# Patient Record
Sex: Male | Born: 1995 | Hispanic: No | Marital: Single | Smoking: Never smoker
Health system: Southern US, Community
[De-identification: ages and names within clinical notes are randomized; demographics above are authoritative.]

## PROBLEM LIST (undated history)

## (undated) DIAGNOSIS — R569 Unspecified convulsions: Secondary | ICD-10-CM

## (undated) HISTORY — PX: APPENDECTOMY: SHX54

---

## 2021-10-22 ENCOUNTER — Encounter (HOSPITAL_COMMUNITY): Payer: Self-pay | Admitting: Emergency Medicine

## 2021-10-22 ENCOUNTER — Emergency Department (HOSPITAL_COMMUNITY)
Admission: EM | Admit: 2021-10-22 | Discharge: 2021-10-22 | Disposition: A | Discharge status: Home / Self Care | Payer: Medicaid Other | Attending: Emergency Medicine | Admitting: Emergency Medicine

## 2021-10-22 DIAGNOSIS — T65291A Toxic effect of other tobacco and nicotine, accidental (unintentional), initial encounter: Secondary | ICD-10-CM | POA: Diagnosis not present

## 2021-10-22 DIAGNOSIS — T6591XA Toxic effect of unspecified substance, accidental (unintentional), initial encounter: Secondary | ICD-10-CM

## 2021-10-22 NOTE — Discharge Instructions (Signed)
Please read the enclosed information about stopping smoking.

## 2021-10-22 NOTE — ED Triage Notes (Signed)
Per pt, states he was smoking a couple of hours ago-he had 2 water bottles one of which he was putting his ashes in-states he accidentally drank the bottle with his smoke ashes in it-states he later felt his hands sweating and his throat was itching-no vomiting, denies pain

## 2021-10-22 NOTE — ED Provider Notes (Signed)
[facility]-EMERGENCY DEPT Provider Note   CSN: [id number] Arrival date & time: 10/22/21  1519     History Chief Complaint  Patient presents with   Ingestion    Nathaniel Velez is a 25 y.o. male who presents today for evaluation of drinking cigar ash. He states that he was smoking a cigar, and had smoked about an inch a bit.  He mixed up the water bottle that he was drinking from in the water bottle that he was putting the ash into.  He states that he accidentally drank it. After his hands got sweaty.  He denies shortness of breath or fevers.  The only ash was the one inch of ash from the cigar he was smoking.  This occurred about 1-2 hours PTA.   HPI     History reviewed. No pertinent past medical history.  There are no problems to display for this patient.   History reviewed. No pertinent surgical history.     No family history on file.  Social History   Substance Use Topics   Drug use: Yes    Home Medications Prior to Admission medications   Not on File    Allergies    Amoxicillin, Ketamine, and Penicillins  Review of Systems   Review of Systems  Constitutional:  Negative for chills and fever.       Sweating hands  HENT:  Positive for sore throat.   Respiratory:  Negative for cough.   Gastrointestinal:  Positive for nausea. Negative for abdominal pain and vomiting.  Neurological:  Negative for headaches.  All other systems reviewed and are negative.  Physical Exam Updated Vital Signs BP (!) 141/99 (BP Location: Right Arm)   Pulse 70   Temp 98.2 F (36.8 C) (Oral)   Resp 17   SpO2 100%   Physical Exam Vitals and nursing note reviewed.  Constitutional:      General: He is not in acute distress.    Appearance: Normal appearance. He is not ill-appearing.  HENT:     Head: Normocephalic and atraumatic.     Mouth/Throat:     Mouth: Mucous membranes are moist.     Pharynx: Oropharynx is clear. No oropharyngeal exudate or  posterior oropharyngeal erythema.  Cardiovascular:     Rate and Rhythm: Normal rate and regular rhythm.     Pulses: Normal pulses.     Heart sounds: Normal heart sounds.  Pulmonary:     Effort: Pulmonary effort is normal. No respiratory distress.     Breath sounds: Normal breath sounds. No wheezing.  Musculoskeletal:     Cervical back: Normal range of motion and neck supple. No rigidity.  Skin:    General: Skin is warm and dry.  Neurological:     Mental Status: He is alert. Mental status is at baseline.     Comments: Awake and alert, answers all questions appropriately.  Speech is not slurred.    Psychiatric:        Mood and Affect: Mood normal.        Behavior: Behavior normal.    ED Results / Procedures / Treatments   Labs (all labs ordered are listed, but only abnormal results are displayed) Labs Reviewed - No data to display  EKG None  Radiology No results found.  Procedures Procedures   Medications Ordered in ED Medications - No data to display  ED Course  I have reviewed the triage vital signs and the nursing notes.  Pertinent labs &  imaging results that were available during my care of the patient were reviewed by me and considered in my medical decision making (see chart for details).    MDM Rules/Calculators/A&P                          Patient is a 25 year old man who presents today for evaluation after he was smoking a cigar, had smoked about 1 inch of the cigar and then accidentally drink the water that he was tapping the ash into.  This occurred about 2 to 3 hours prior to my evaluation.  He is awake and alert and generally well-appearing.  Aside from his throat itching which resolved, his hand sweating which has improved, and his mild nausea without vomiting he is not having any significant symptoms.  This does not appear to be a significant amount of ingestion because nicotine poisoning given the small amount, and that he was smoking a cigar. Recommended  smoking cessation.  He is hemodynamically stable.  Return precautions were discussed with patient who states their understanding.  At the time of discharge patient denied any unaddressed complaints or concerns.  Patient is agreeable for discharge home.  Note: Portions of this report may have been transcribed using voice recognition software. Every effort was made to ensure accuracy; however, inadvertent computerized transcription errors may be present   Final Clinical Impression(s) / ED Diagnoses Final diagnoses:  Accidental ingestion of substance, initial encounter    Rx / DC Orders ED Discharge Orders     None        Cristina Gong, PA-C 10/22/21 1656    Charlynne Pander, MD 10/22/21 1715

## 2021-10-22 NOTE — ED Notes (Signed)
An After Visit Summary was printed and given to the patient. Discharge instructions given and no further questions at this time.  

## 2022-02-18 ENCOUNTER — Encounter (HOSPITAL_BASED_OUTPATIENT_CLINIC_OR_DEPARTMENT_OTHER): Payer: Self-pay

## 2022-02-18 ENCOUNTER — Other Ambulatory Visit: Payer: Self-pay

## 2022-02-18 ENCOUNTER — Emergency Department (HOSPITAL_BASED_OUTPATIENT_CLINIC_OR_DEPARTMENT_OTHER): Payer: Medicaid Other

## 2022-02-18 ENCOUNTER — Emergency Department (HOSPITAL_BASED_OUTPATIENT_CLINIC_OR_DEPARTMENT_OTHER)
Admission: EM | Admit: 2022-02-18 | Discharge: 2022-02-18 | Disposition: A | Discharge status: Home / Self Care | Payer: Medicaid Other | Attending: Emergency Medicine | Admitting: Emergency Medicine

## 2022-02-18 DIAGNOSIS — R0989 Other specified symptoms and signs involving the circulatory and respiratory systems: Secondary | ICD-10-CM | POA: Diagnosis present

## 2022-02-18 HISTORY — DX: Unspecified convulsions: R56.9

## 2022-02-18 NOTE — ED Notes (Signed)
Pt states thinks he swallowed a chx bone that he was  eating,  pt is not drooling and handling secretions well not spitting at this time ,  states he is from [place] and  is working up here ?

## 2022-02-18 NOTE — ED Notes (Signed)
No dyspnea noted, phonating well, no stridor, speaking in complete sentences.   ?

## 2022-02-18 NOTE — Discharge Instructions (Signed)
Return to the ER if you start to experience worsening symptoms, trouble breathing, trouble swallowing or uncontrollable vomiting ?

## 2022-02-18 NOTE — ED Notes (Signed)
Pt states he refused CT scan and is ready to go, PA into speak to him again ?

## 2022-02-18 NOTE — ED Triage Notes (Signed)
Pt arrives with reports of eating chicken today states he swallowed a chicken bone and can fill it in his upper throat. RT at bedside reports lung sounds clear. Pt speaking in full sentences. Denies any history of trouble swallowing.  ?

## 2022-02-18 NOTE — ED Provider Notes (Signed)
?[facility] EMERGENCY DEPARTMENT ?Provider Note ? ? ?CSN: [id number] ?Arrival date & time: 02/18/22  1459 ? ?  ? ?History ? ?Chief Complaint  ?Patient presents with  ? Swallowed Foreign Body  ? ? ?Nathaniel Velez is a 26 y.o. male presenting to the ED with a chief complaint of foreign body sensation in his throat.  States that approximately 30 minutes prior to arrival he was eating chicken and felt a chicken bone become lodged in his throat.  States that he can still feel the bone in his throat.  He is concerned that it is moving when he is swallowing.  He is tolerating liquids but states that he does not want to drink any liquids because "then I'll feel it move down."  Denies any vomiting or chest pain.  No history of similar symptoms in the past. ? ? ?Swallowed Foreign Body ?Pertinent negatives include no chest pain, no abdominal pain and no shortness of breath.  ? ?  ? ?Home Medications ?Prior to Admission medications   ?Not on File  ?   ? ?Allergies    ?Amoxicillin, Ketamine, and Penicillins   ? ?Review of Systems   ?Review of Systems  ?Constitutional:  Negative for appetite change, chills and fever.  ?HENT:  Negative for ear pain, rhinorrhea, sneezing and sore throat.   ?Eyes:  Negative for photophobia and visual disturbance.  ?Respiratory:  Negative for cough, chest tightness, shortness of breath and wheezing.   ?Cardiovascular:  Negative for chest pain and palpitations.  ?Gastrointestinal:  Negative for abdominal pain, blood in stool, constipation, diarrhea, nausea and vomiting.  ?Genitourinary:  Negative for dysuria, hematuria and urgency.  ?Musculoskeletal:  Negative for myalgias.  ?Skin:  Negative for rash.  ?Neurological:  Negative for dizziness, weakness and light-headedness.  ? ?Physical Exam ?Updated Vital Signs ?BP 128/76   Pulse 86   Temp 99.5 ?F (37.5 ?C) (Oral)   Resp 18   Ht 5\' 8"  (1.727 m)   Wt 58.1 kg   SpO2 100%   BMI 19.46 kg/m?  ?Physical Exam ?Vitals and nursing note  reviewed.  ?Constitutional:   ?   General: He is not in acute distress. ?   Appearance: He is well-developed.  ?   Comments: Normal phonation.  No signs of distress.  Tolerating secretions without difficulty.  ?HENT:  ?   Head: Normocephalic and atraumatic.  ?   Nose: Nose normal.  ?Eyes:  ?   General: No scleral icterus.    ?   Left eye: No discharge.  ?   Conjunctiva/sclera: Conjunctivae normal.  ?Cardiovascular:  ?   Rate and Rhythm: Normal rate and regular rhythm.  ?   Heart sounds: Normal heart sounds. No murmur heard. ?  No friction rub. No gallop.  ?Pulmonary:  ?   Effort: Pulmonary effort is normal. No respiratory distress.  ?   Breath sounds: Normal breath sounds.  ?Abdominal:  ?   General: Bowel sounds are normal. There is no distension.  ?   Palpations: Abdomen is soft.  ?   Tenderness: There is no abdominal tenderness. There is no guarding.  ?Musculoskeletal:     ?   General: Normal range of motion.  ?   Cervical back: Normal range of motion and neck supple.  ?Skin: ?   General: Skin is warm and dry.  ?   Findings: No rash.  ?Neurological:  ?   Mental Status: He is alert.  ?   Motor: No abnormal muscle  tone.  ?   Coordination: Coordination normal.  ? ? ?ED Results / Procedures / Treatments   ?Labs ?(all labs ordered are listed, but only abnormal results are displayed) ?Labs Reviewed - No data to display ? ?EKG ?None ? ?Radiology ?DG Neck Soft Tissue ? ?Result Date: 02/18/2022 ?CLINICAL DATA:  chicken bone. 26 y/o male. Pt arrives with reports of eating chicken today states he swallowed a chicken bone and can fill it in his upper throat EXAM: NECK SOFT TISSUES - 1+ VIEW COMPARISON:  None. FINDINGS: There is no evidence of retropharyngeal soft tissue swelling or epiglottic enlargement. The cervical airway is unremarkable and no radiographic evidence of retained radio-opaque foreign body . IMPRESSION: Negative.  Please consider CT if clinically indicated. Electronically Signed   By: Tish Frederickson M.D.   On:  02/18/2022 15:48   ? ?Procedures ?Procedures  ? ? ?Medications Ordered in ED ?Medications - No data to display ? ?ED Course/ Medical Decision Making/ A&P ?Clinical Course as of 02/18/22 1700  ?Thu Feb 18, 2022  ?1553 DG Neck Soft Tissue [HK]  ?  ?Clinical Course User Index ?[HK] Idelle Leech, Renai Lopata, PA-C  ? ?                        ?Medical Decision Making ?Amount and/or Complexity of Data Reviewed ?Radiology: ordered. Decision-making details documented in ED Course. ? ? ?26 year old male presenting to the ED for foreign body sensation in his throat.  He states that he can feel a chicken bone lodged in his throat. He is tolerating liquids and he is not in distress. His lungs are clear bilaterally. He is able to tolerate water in front of me. However he states he can feel the chicken bone in his throat. He appears anxious. His neck x-ray is negative.  Will obtain CT to definitively rule out foreign body. ? ?On recheck patient states that his symptoms have resolved.  He is declining CT scan which I feel is reasonable as I have low suspicion for a foreign body in the esophagus.  He remains hemodynamically stable, in no distress and able to tolerate secretions.  He knows to return for any worsening symptoms. ? ?All imaging, if done today, including plain films, CT scans, and ultrasounds, independently reviewed by me, and interpretations confirmed via formal radiology reads. ? ?Patient is hemodynamically stable, in NAD, and able to ambulate in the ED. Evaluation does not show pathology that would require ongoing emergent intervention or inpatient treatment. I explained the diagnosis to the patient. Pain has been managed and has no complaints prior to discharge. Patient is comfortable with above plan and is stable for discharge at this time. All questions were answered prior to disposition. Strict return precautions for returning to the ED were discussed. Encouraged follow up with PCP.  ? ?An After Visit Summary was printed and  given to the patient. ? ? ?Portions of this note were generated with Scientist, clinical (histocompatibility and immunogenetics). Dictation errors may occur despite best attempts at proofreading. ? ? ? ? ? ? ? ?Final Clinical Impression(s) / ED Diagnoses ?Final diagnoses:  ?Foreign body sensation in throat  ? ? ?Rx / DC Orders ?ED Discharge Orders   ? ? None  ? ?  ? ? ?  Dietrich Pates, PA-C ?02/18/22 1700 ? ?  ?Maia Plan, MD ?02/26/22 [phone] ? ?

## 2022-09-06 ENCOUNTER — Emergency Department (HOSPITAL_COMMUNITY)
Admission: EM | Admit: 2022-09-06 | Discharge: 2022-09-06 | Discharge status: Against Medical Advice | Payer: Medicaid Other

## 2022-09-06 NOTE — ED Notes (Signed)
Patient states the wait is long and he is leaving 

## 2022-09-07 ENCOUNTER — Encounter (HOSPITAL_COMMUNITY): Payer: Self-pay | Admitting: Emergency Medicine

## 2022-09-07 ENCOUNTER — Emergency Department (HOSPITAL_COMMUNITY)
Admission: EM | Admit: 2022-09-07 | Discharge: 2022-09-07 | Discharge status: Against Medical Advice | Payer: Medicaid Other | Attending: Emergency Medicine | Admitting: Emergency Medicine

## 2022-09-07 ENCOUNTER — Other Ambulatory Visit: Payer: Self-pay

## 2022-09-07 DIAGNOSIS — H11421 Conjunctival edema, right eye: Secondary | ICD-10-CM | POA: Insufficient documentation

## 2022-09-07 DIAGNOSIS — Z5321 Procedure and treatment not carried out due to patient leaving prior to being seen by health care provider: Secondary | ICD-10-CM | POA: Insufficient documentation

## 2022-09-07 MED ORDER — IBUPROFEN 400 MG PO TABS
600.0000 mg | ORAL_TABLET | Freq: Once | ORAL | Status: AC
  Administered 2022-09-07: 600 mg via ORAL
  Filled 2022-09-07: qty 1

## 2022-09-07 NOTE — ED Notes (Signed)
Patient left on own accord °

## 2022-09-07 NOTE — ED Triage Notes (Signed)
Patient reports right eyelid swelling onset 2 days ago , no vision loss or injury .

## 2022-09-07 NOTE — ED Provider Triage Note (Signed)
Emergency Medicine Provider Triage Evaluation Note  Nathaniel Velez , a 26 y.o. male  was evaluated in triage.  Pt complains of right eye swelling. Started two days ago. Endorses FB sensation. Blinking is painful. Mostly painful on the right eyelid. Swelling started on right eye lid and now getting worse which prompted him to be evaluated in the ED. No fever. Right eye vision is more blurry today.    Review of Systems  Positive: See above Negative: See above  Physical Exam  BP 130/84 (BP Location: Right Arm)   Pulse 74   Temp 97.9 F (36.6 C) (Oral)   Resp 18   SpO2 100%  Gen:   Awake, no distress   Resp:  Normal effort  MSK:   Moves extremities without difficulty  Other:  Right eye lid swollen and red. Could not visualize possible abrasion or FB  Medical Decision Making  Medically screening exam initiated at 8:34 PM.  Appropriate orders placed.  [name] was informed that the remainder of the evaluation will be completed by another provider, this initial triage assessment does not replace that evaluation, and the importance of remaining in the ED until their evaluation is complete.  Plan: Ibuprofen for pain   Harriet Pho, PA-C 09/07/22 2040

## 2022-09-08 ENCOUNTER — Encounter (HOSPITAL_COMMUNITY): Payer: Self-pay | Admitting: Emergency Medicine

## 2022-09-08 ENCOUNTER — Emergency Department (HOSPITAL_COMMUNITY)
Admission: EM | Admit: 2022-09-08 | Discharge: 2022-09-08 | Disposition: A | Discharge status: Home / Self Care | Payer: Medicaid Other | Attending: Emergency Medicine | Admitting: Emergency Medicine

## 2022-09-08 DIAGNOSIS — H00011 Hordeolum externum right upper eyelid: Secondary | ICD-10-CM | POA: Insufficient documentation

## 2022-09-08 DIAGNOSIS — H019 Unspecified inflammation of eyelid: Secondary | ICD-10-CM | POA: Diagnosis present

## 2022-09-08 MED ORDER — CLINDAMYCIN HCL 150 MG PO CAPS: 150.0000 mg | ORAL_CAPSULE | Freq: Four times a day (QID) | ORAL | 0 refills | Status: AC

## 2022-09-08 NOTE — Discharge Instructions (Signed)
Your symptoms is likely due to a stye.  Please apply warm moist compress 5-7 times a day to help with relief.  If doing so for the next 2-3 days and you notice no improvement, then take antibiotic as prescribed.

## 2022-09-08 NOTE — ED Provider Notes (Signed)
[facility] EMERGENCY DEPARTMENT Provider Note   CSN: [id number] Arrival date & time: 09/08/22  1739     History  Chief Complaint  Patient presents with   Eyelid Pain    Nathaniel Velez is a 26 y.o. male.  The history is provided by the patient and medical records. No language interpreter was used.     27 year old male here with painful eyelid.  Report pain, swelling and redness to R upper eyelid x 3 days.  No fever, changes in vision, pain with eye movement.  Does report fb sensation.  Denies trauma.  Report some itchiness.  Have been using ice pack with minimal relief.  Does not wear glasses or contact lens.    Home Medications Prior to Admission medications   Not on File      Allergies    Amoxicillin, Ketamine, and Penicillins    Review of Systems   Review of Systems  All other systems reviewed and are negative.   Physical Exam Updated Vital Signs BP 128/88   Pulse 79   Temp 98.8 F (37.1 C) (Oral)   Resp 16   SpO2 98%  Physical Exam Vitals and nursing note reviewed.  Constitutional:      General: He is not in acute distress.    Appearance: He is well-developed.  HENT:     Head: Atraumatic.  Eyes:     Conjunctiva/sclera: Conjunctivae normal.     Comments: R upper eyelid is edematous, erythematous and warmth with ttp.  Normal eye appearance.  EOMI, PERRL.  No pain with eye movement.  No fb noted  Musculoskeletal:     Cervical back: Neck supple.  Skin:    Findings: No rash.  Neurological:     Mental Status: He is alert.     ED Results / Procedures / Treatments   Labs (all labs ordered are listed, but only abnormal results are displayed) Labs Reviewed - No data to display  EKG None  Radiology No results found.  Procedures Procedures    Medications Ordered in ED Medications - No data to display  ED Course/ Medical Decision Making/ A&P                           Medical Decision Making  BP 128/88   Pulse 79    Temp 98.8 F (37.1 C) (Oral)   Resp 16   SpO2 98%   6:18 PM Pt here with pain and swelling along with redness to R upper eyelid x 3 days. Fb sensation. No visual changes, no trauma.  Does not wear contact lens or prescription glasses.  Have been using ice pack with minimal relief  On exam R upper eyelid is erythematous, edematous and warmth consistent with hordeolum.  Normal conjunctiva and sclera.  No fb noted.  I have considered preseptal cellulitis. Doubt orbital cellulitis.   Recommend warm compress.  After discussion will prescribe abx to use if no improvement with warm compress for 2-3 days.  Pt voice understanding and agrees with plan. Normal vision.         Final Clinical Impression(s) / ED Diagnoses Final diagnoses:  Hordeolum externum of right upper eyelid    Rx / DC Orders ED Discharge Orders          Ordered    clindamycin (CLEOCIN) 150 MG capsule  Every 6 hours        09/08/22 1821  Domenic Moras, PA-C 09/08/22 Dennard Schaumann, MD 09/08/22 [phone]

## 2022-09-08 NOTE — ED Triage Notes (Signed)
Patient complains of right eyelid pain and swelling that started a few days ago. Patient is alert, oriented, and in no apparent distress at this time.

## 2023-08-26 IMAGING — CR DG NECK SOFT TISSUE
2 series · 2 of 2 positions shown · non-contrast
Comparison: None.

CLINICAL DATA: chicken bone. 25 y/o male. Pt arrives with reports
of eating chicken today states he swallowed a chicken bone and can
fill it in his upper throat

EXAM:
NECK SOFT TISSUES - 1+ VIEW

[w soft tissue neck]
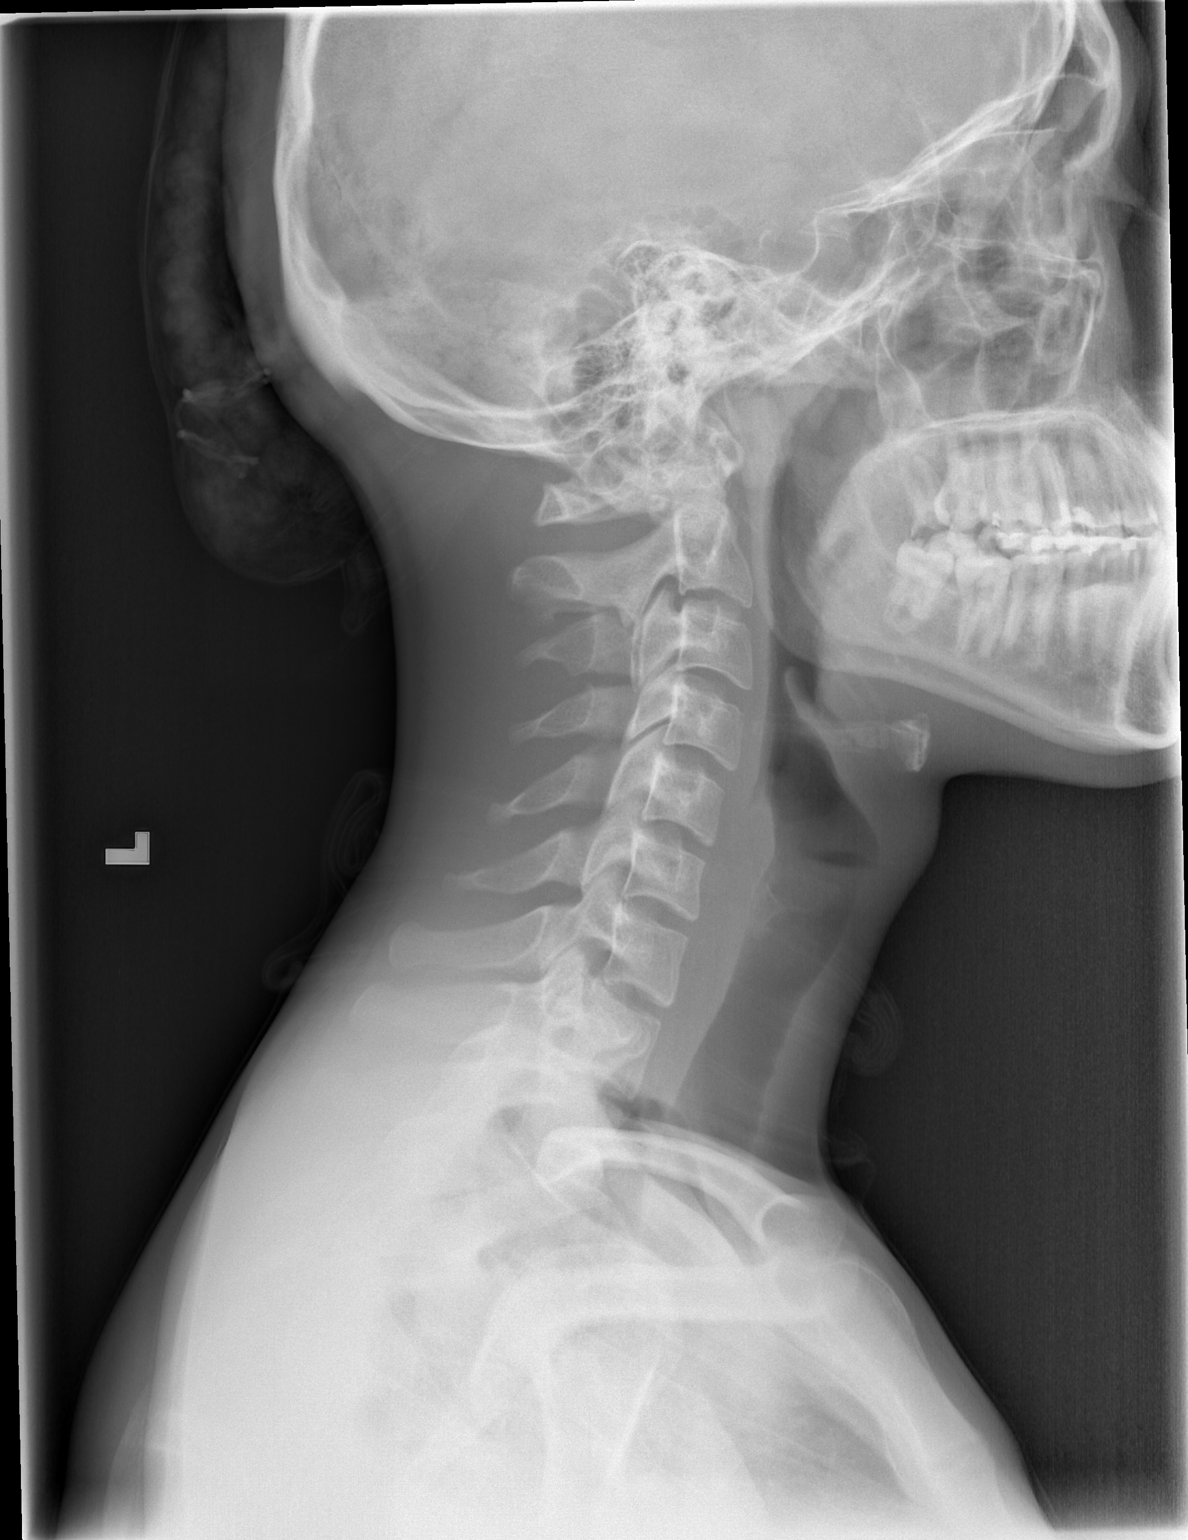

[w soft tissue neck ap]
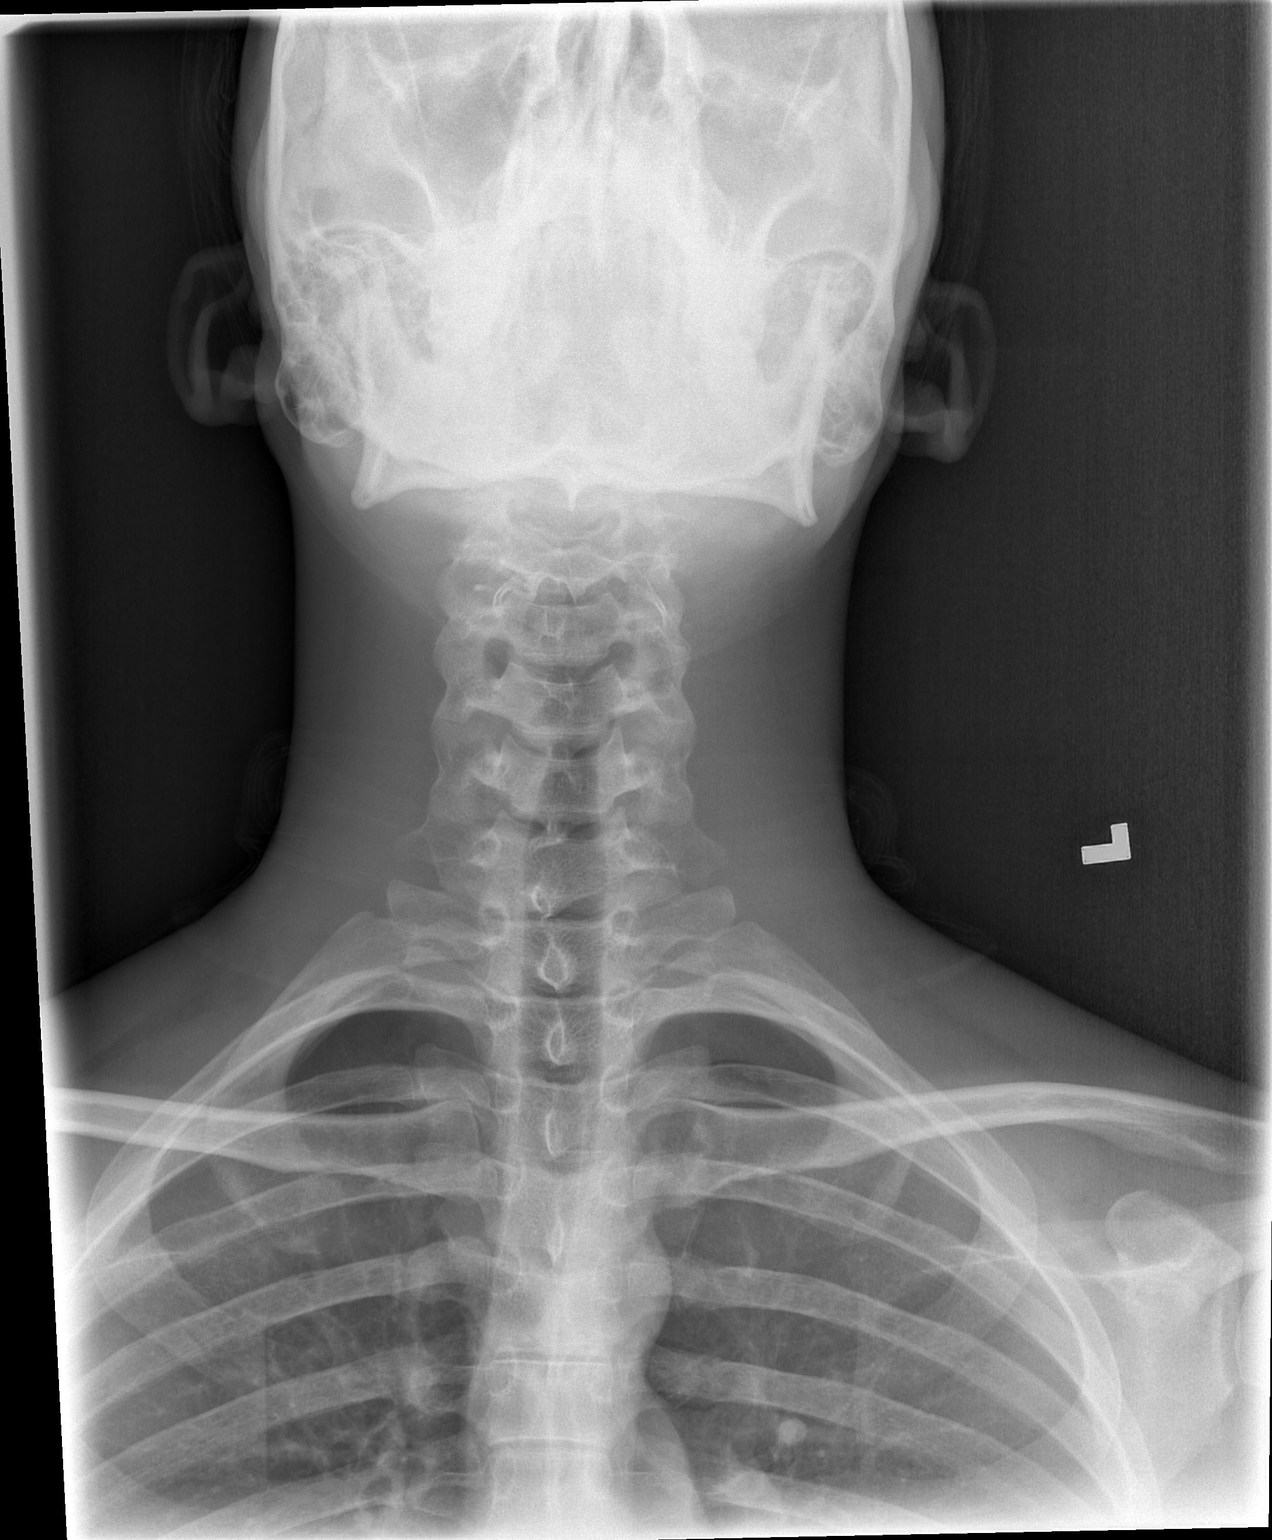

[2 of 2 positions shown; findings below may reference images not displayed]

FINDINGS: There is no evidence of retropharyngeal soft tissue swelling or
epiglottic enlargement. The cervical airway is unremarkable and no
radiographic evidence of retained radio-opaque foreign body .
IMPRESSION: Negative.  Please consider CT if clinically indicated.
